# Patient Record
Sex: Female | Born: 1945 | Race: Black or African American | Hispanic: No | Marital: Married | Smoking: Never smoker
Health system: Southern US, Community
[De-identification: ages and names within clinical notes are randomized; demographics above are authoritative.]

## PROBLEM LIST (undated history)

## (undated) DIAGNOSIS — I10 Essential (primary) hypertension: Secondary | ICD-10-CM

---

## 2012-12-08 ENCOUNTER — Other Ambulatory Visit: Payer: Self-pay | Admitting: Family Medicine

## 2012-12-08 ENCOUNTER — Other Ambulatory Visit (HOSPITAL_COMMUNITY)
Admission: RE | Admit: 2012-12-08 | Discharge: 2012-12-08 | Disposition: A | Discharge status: Home / Self Care | Payer: Medicare Other | Source: Ambulatory Visit | Attending: Family Medicine | Admitting: Family Medicine

## 2012-12-08 DIAGNOSIS — Z124 Encounter for screening for malignant neoplasm of cervix: Secondary | ICD-10-CM | POA: Insufficient documentation

## 2012-12-08 DIAGNOSIS — Z1231 Encounter for screening mammogram for malignant neoplasm of breast: Secondary | ICD-10-CM

## 2012-12-08 DIAGNOSIS — E2839 Other primary ovarian failure: Secondary | ICD-10-CM

## 2013-01-11 ENCOUNTER — Ambulatory Visit
Admission: RE | Admit: 2013-01-11 | Discharge: 2013-01-11 | Disposition: A | Discharge status: Home / Self Care | Payer: Medicare Other | Source: Ambulatory Visit | Attending: Family Medicine | Admitting: Family Medicine

## 2013-01-11 DIAGNOSIS — E2839 Other primary ovarian failure: Secondary | ICD-10-CM

## 2013-01-11 DIAGNOSIS — Z1231 Encounter for screening mammogram for malignant neoplasm of breast: Secondary | ICD-10-CM

## 2013-12-07 ENCOUNTER — Other Ambulatory Visit: Payer: Self-pay

## 2013-12-07 DIAGNOSIS — Z1231 Encounter for screening mammogram for malignant neoplasm of breast: Secondary | ICD-10-CM

## 2014-01-12 ENCOUNTER — Ambulatory Visit: Payer: Medicare Other

## 2014-01-16 ENCOUNTER — Ambulatory Visit
Admission: RE | Admit: 2014-01-16 | Discharge: 2014-01-16 | Disposition: A | Discharge status: Home / Self Care | Payer: Medicare Other | Source: Ambulatory Visit

## 2014-01-16 DIAGNOSIS — Z1231 Encounter for screening mammogram for malignant neoplasm of breast: Secondary | ICD-10-CM

## 2014-12-18 ENCOUNTER — Other Ambulatory Visit: Payer: Self-pay

## 2014-12-18 DIAGNOSIS — Z1231 Encounter for screening mammogram for malignant neoplasm of breast: Secondary | ICD-10-CM

## 2015-01-29 ENCOUNTER — Ambulatory Visit
Admission: RE | Admit: 2015-01-29 | Discharge: 2015-01-29 | Disposition: A | Discharge status: Home / Self Care | Payer: Medicare Other | Source: Ambulatory Visit

## 2015-01-29 DIAGNOSIS — Z1231 Encounter for screening mammogram for malignant neoplasm of breast: Secondary | ICD-10-CM

## 2015-03-16 ENCOUNTER — Other Ambulatory Visit: Payer: Self-pay | Admitting: Family Medicine

## 2015-03-16 DIAGNOSIS — E2839 Other primary ovarian failure: Secondary | ICD-10-CM

## 2015-05-16 ENCOUNTER — Ambulatory Visit
Admission: RE | Admit: 2015-05-16 | Discharge: 2015-05-16 | Disposition: A | Discharge status: Home / Self Care | Payer: Medicare Other | Source: Ambulatory Visit | Attending: Family Medicine | Admitting: Family Medicine

## 2015-05-16 DIAGNOSIS — E2839 Other primary ovarian failure: Secondary | ICD-10-CM

## 2016-01-25 ENCOUNTER — Other Ambulatory Visit: Payer: Self-pay | Admitting: Family Medicine

## 2016-01-25 DIAGNOSIS — Z1231 Encounter for screening mammogram for malignant neoplasm of breast: Secondary | ICD-10-CM

## 2016-02-06 ENCOUNTER — Ambulatory Visit
Admission: RE | Admit: 2016-02-06 | Discharge: 2016-02-06 | Disposition: A | Discharge status: Home / Self Care | Payer: Medicare Other | Source: Ambulatory Visit | Attending: Family Medicine | Admitting: Family Medicine

## 2016-02-06 ENCOUNTER — Ambulatory Visit: Payer: Medicare Other

## 2016-02-06 DIAGNOSIS — Z1231 Encounter for screening mammogram for malignant neoplasm of breast: Secondary | ICD-10-CM

## 2016-05-25 ENCOUNTER — Encounter (HOSPITAL_COMMUNITY): Payer: Self-pay | Admitting: Emergency Medicine

## 2016-05-25 ENCOUNTER — Ambulatory Visit (HOSPITAL_COMMUNITY)
Admission: EM | Admit: 2016-05-25 | Discharge: 2016-05-25 | Disposition: A | Discharge status: Home / Self Care | Payer: Medicare Other | Attending: Emergency Medicine | Admitting: Emergency Medicine

## 2016-05-25 DIAGNOSIS — J069 Acute upper respiratory infection, unspecified: Secondary | ICD-10-CM | POA: Diagnosis not present

## 2016-05-25 DIAGNOSIS — B9789 Other viral agents as the cause of diseases classified elsewhere: Secondary | ICD-10-CM

## 2016-05-25 HISTORY — DX: Essential (primary) hypertension: I10

## 2016-05-25 MED ORDER — GUAIFENESIN ER 600 MG PO TB12: 600.0000 mg | ORAL_TABLET | Freq: Two times a day (BID) | ORAL | 0 refills | Status: AC | PRN

## 2016-05-25 MED ORDER — BENZONATATE 100 MG PO CAPS: 100.0000 mg | ORAL_CAPSULE | Freq: Three times a day (TID) | ORAL | 0 refills | Status: AC

## 2016-05-25 MED ORDER — AZITHROMYCIN 250 MG PO TABS: 250.0000 mg | ORAL_TABLET | Freq: Every day | ORAL | 0 refills | Status: AC

## 2016-05-25 NOTE — Discharge Instructions (Signed)
Your symptoms are likely due to a virus such as the common cold, however, if you developing worsening chest congestion with shortness of breath, persistent fever for 3 days, or symptoms not improving in 4-5 days, you may fill the antibiotic (azithromycin).  If you do fill the antibiotic,  please take antibiotics as prescribed and be sure to complete entire course even if you start to feel better to ensure infection does not come back.  You may stop taking over the counter Theraflu and start taking the prescribed benzonatate (Tessalon) and guaifenesin for your cough and congestion.  You may also take acetaminophen and ibuprofen for fever or pain.  Be sure to get at least 8 hours of sleep at night, preferably more while sick and to stay well hydrated with juice, sports drinks, and soups or broths.

## 2016-05-25 NOTE — ED Provider Notes (Signed)
CSN: [id number]     Arrival date & time 05/25/16  1159 History   First MD Initiated Contact with Patient 05/25/16 1232     No chief complaint on file.  (Consider location/radiation/quality/duration/timing/severity/associated sxs/prior Treatment) HPI  Tiffany Barrett is a 71 y.o. female presenting to UC with c/o cough for 6 days, minimally productive, mild sore throat from cough. Mild nausea and dizziness while in UC waiting room but that has since resolved.  Denies fever, chills, body aches, vomiting or diarrhea. No known sick contacts. Denies hx of asthma or COPD.   Past Medical History:  Diagnosis Date  . Hypertension    Past Surgical History:  Procedure Laterality Date  . CESAREAN SECTION     History reviewed. No pertinent family history. Social History  Substance Use Topics  . Smoking status: Never Smoker  . Smokeless tobacco: Never Used  . Alcohol use Yes     Comment: monthly   OB History    No data available     Review of Systems  Constitutional: Negative for chills and fever.  HENT: Positive for congestion and sore throat ( minimal). Negative for ear pain, trouble swallowing and voice change.   Respiratory: Positive for cough. Negative for shortness of breath.   Cardiovascular: Negative for chest pain and palpitations.  Gastrointestinal: Positive for nausea. Negative for abdominal pain, diarrhea and vomiting.  Musculoskeletal: Negative for arthralgias, back pain and myalgias.  Skin: Negative for rash.  Neurological: Positive for dizziness ( mild). Negative for light-headedness and headaches.    Allergies  Patient has no known allergies.  Home Medications   Prior to Admission medications   Medication Sig Start Date End Date Taking? Authorizing Provider  losartan-hydrochlorothiazide (HYZAAR) 50-12.5 MG tablet Take 1 tablet by mouth daily.   Yes Historical Provider, MD  azithromycin (ZITHROMAX) 250 MG tablet Take 1 tablet (250 mg total) by mouth daily. Take first  2 tablets together, then 1 every day until finished. 05/25/16   Noland Fordyce, PA-C  benzonatate (TESSALON) 100 MG capsule Take 1 capsule (100 mg total) by mouth every 8 (eight) hours. 05/25/16   Noland Fordyce, PA-C  guaiFENesin (MUCINEX) 600 MG 12 hr tablet Take 1-2 tablets (600-1,200 mg total) by mouth 2 (two) times daily as needed for to loosen phlegm. Take with a large glass of water 05/25/16   Noland Fordyce, PA-C   Meds Ordered and Administered this Visit  Medications - No data to display  BP 107/71 (BP Location: Right Arm)   Temp 97.9 F (36.6 C) (Oral)   Resp 18   SpO2 98%  No data found.   Physical Exam  Constitutional: She is oriented to person, place, and time. She appears well-developed and well-nourished. No distress.  HENT:  Head: Normocephalic and atraumatic.  Right Ear: Tympanic membrane normal.  Left Ear: Tympanic membrane normal.  Nose: Nose normal.  Mouth/Throat: Uvula is midline, oropharynx is clear and moist and mucous membranes are normal.  Eyes: EOM are normal.  Neck: Normal range of motion. Neck supple.  Cardiovascular: Normal rate and regular rhythm.   Pulmonary/Chest: Effort normal and breath sounds normal. No stridor. No respiratory distress. She has no wheezes. She has no rales.  Musculoskeletal: Normal range of motion.  Lymphadenopathy:    She has no cervical adenopathy.  Neurological: She is alert and oriented to person, place, and time.  Skin: Skin is warm and dry. She is not diaphoretic.  Psychiatric: She has a normal mood and affect. Her behavior is  normal.  Nursing note and vitals reviewed.   Urgent Care Course     Procedures (including critical care time)  Labs Review Labs Reviewed - No data to display  Imaging Review No results found.    MDM   1. Viral URI with cough    Pt c/o 6 days of URI symptoms.  Lungs: CTAB Pt is febrile.  No evidence of bacterial infection at this time. Symptoms likely viral. Encouraged symptomatic  treatment. She may stop taking Theraflu, which may be causing nausea and dizziness. Rx: Tessalon and mucinex  Prescription to hold with expiration date for azithromycin. Pt to fill if persistent fever develops or not improving in 1 week.  F/u with PCP in 7-10 days if not improving. Discussed symptoms that warrant emergent care in the ED. Patient verbalized understanding and agreement with treatment plan.     Noland Fordyce, PA-C 05/25/16 1257

## 2016-05-25 NOTE — ED Triage Notes (Signed)
The patient presented to the [facility] with a complaint of a cough x 6 days. The patient reported using OTC meds with minimal results.

## 2016-06-10 ENCOUNTER — Other Ambulatory Visit (HOSPITAL_COMMUNITY)
Admission: RE | Admit: 2016-06-10 | Discharge: 2016-06-10 | Disposition: A | Discharge status: Home / Self Care | Payer: Medicare Other | Source: Ambulatory Visit | Attending: Family Medicine | Admitting: Family Medicine

## 2016-06-10 ENCOUNTER — Other Ambulatory Visit: Payer: Self-pay | Admitting: Family Medicine

## 2016-06-10 DIAGNOSIS — Z124 Encounter for screening for malignant neoplasm of cervix: Secondary | ICD-10-CM | POA: Insufficient documentation

## 2016-06-13 LAB — CYTOLOGY - PAP: Diagnosis: NEGATIVE

## 2017-01-06 ENCOUNTER — Other Ambulatory Visit: Payer: Self-pay | Admitting: Family Medicine

## 2017-01-06 DIAGNOSIS — Z1231 Encounter for screening mammogram for malignant neoplasm of breast: Secondary | ICD-10-CM

## 2017-02-06 ENCOUNTER — Ambulatory Visit
Admission: RE | Admit: 2017-02-06 | Discharge: 2017-02-06 | Disposition: A | Discharge status: Home / Self Care | Payer: Medicare Other | Source: Ambulatory Visit | Attending: Family Medicine | Admitting: Family Medicine

## 2017-02-06 DIAGNOSIS — Z1231 Encounter for screening mammogram for malignant neoplasm of breast: Secondary | ICD-10-CM

## 2017-06-26 ENCOUNTER — Other Ambulatory Visit: Payer: Self-pay | Admitting: Family Medicine

## 2017-06-26 DIAGNOSIS — E2839 Other primary ovarian failure: Secondary | ICD-10-CM

## 2017-07-01 ENCOUNTER — Other Ambulatory Visit: Payer: Self-pay | Admitting: Family Medicine

## 2017-07-01 DIAGNOSIS — Z1231 Encounter for screening mammogram for malignant neoplasm of breast: Secondary | ICD-10-CM

## 2018-02-08 ENCOUNTER — Other Ambulatory Visit: Payer: Medicare Other

## 2018-02-08 ENCOUNTER — Inpatient Hospital Stay: Admission: RE | Admit: 2018-02-08 | Payer: Medicare Other | Source: Ambulatory Visit

## 2018-03-22 ENCOUNTER — Ambulatory Visit
Admission: RE | Admit: 2018-03-22 | Discharge: 2018-03-22 | Disposition: A | Discharge status: Home / Self Care | Payer: Medicare Other | Source: Ambulatory Visit | Attending: Family Medicine | Admitting: Family Medicine

## 2018-03-22 DIAGNOSIS — Z1231 Encounter for screening mammogram for malignant neoplasm of breast: Secondary | ICD-10-CM

## 2018-03-22 DIAGNOSIS — E2839 Other primary ovarian failure: Secondary | ICD-10-CM

## 2019-02-15 ENCOUNTER — Other Ambulatory Visit: Payer: Self-pay | Admitting: Family Medicine

## 2019-02-15 DIAGNOSIS — Z1231 Encounter for screening mammogram for malignant neoplasm of breast: Secondary | ICD-10-CM

## 2019-04-04 ENCOUNTER — Ambulatory Visit
Admission: RE | Admit: 2019-04-04 | Discharge: 2019-04-04 | Disposition: A | Discharge status: Home / Self Care | Payer: Medicare Other | Source: Ambulatory Visit | Attending: Family Medicine | Admitting: Family Medicine

## 2019-04-04 ENCOUNTER — Other Ambulatory Visit: Payer: Self-pay

## 2019-04-04 DIAGNOSIS — Z1231 Encounter for screening mammogram for malignant neoplasm of breast: Secondary | ICD-10-CM

## 2019-06-01 ENCOUNTER — Ambulatory Visit: Payer: Medicare Other

## 2019-06-03 ENCOUNTER — Ambulatory Visit: Payer: Medicare Other

## 2019-06-14 ENCOUNTER — Ambulatory Visit: Payer: Medicare Other

## 2019-06-18 ENCOUNTER — Ambulatory Visit: Payer: Medicare Other

## 2020-01-30 ENCOUNTER — Other Ambulatory Visit: Payer: Self-pay | Admitting: Family Medicine

## 2020-01-30 DIAGNOSIS — Z1239 Encounter for other screening for malignant neoplasm of breast: Secondary | ICD-10-CM

## 2020-01-31 ENCOUNTER — Other Ambulatory Visit: Payer: Self-pay | Admitting: Family Medicine

## 2020-01-31 DIAGNOSIS — E2839 Other primary ovarian failure: Secondary | ICD-10-CM

## 2020-01-31 DIAGNOSIS — M8580 Other specified disorders of bone density and structure, unspecified site: Secondary | ICD-10-CM

## 2020-04-12 ENCOUNTER — Ambulatory Visit
Admission: RE | Admit: 2020-04-12 | Discharge: 2020-04-12 | Disposition: A | Discharge status: Home / Self Care | Payer: Medicare Other | Source: Ambulatory Visit | Attending: Family Medicine | Admitting: Family Medicine

## 2020-04-12 ENCOUNTER — Other Ambulatory Visit: Payer: Self-pay

## 2020-04-12 DIAGNOSIS — Z1239 Encounter for other screening for malignant neoplasm of breast: Secondary | ICD-10-CM

## 2020-05-17 ENCOUNTER — Other Ambulatory Visit: Payer: Medicare Other

## 2020-08-13 DIAGNOSIS — M545 Low back pain, unspecified: Secondary | ICD-10-CM | POA: Diagnosis not present

## 2020-08-13 DIAGNOSIS — K219 Gastro-esophageal reflux disease without esophagitis: Secondary | ICD-10-CM | POA: Diagnosis not present

## 2020-08-13 DIAGNOSIS — R7303 Prediabetes: Secondary | ICD-10-CM | POA: Diagnosis not present

## 2020-08-13 DIAGNOSIS — Z0000 Encounter for general adult medical examination without abnormal findings: Secondary | ICD-10-CM | POA: Diagnosis not present

## 2020-08-13 DIAGNOSIS — J309 Allergic rhinitis, unspecified: Secondary | ICD-10-CM | POA: Diagnosis not present

## 2020-08-13 DIAGNOSIS — Z1389 Encounter for screening for other disorder: Secondary | ICD-10-CM | POA: Diagnosis not present

## 2020-08-13 DIAGNOSIS — I10 Essential (primary) hypertension: Secondary | ICD-10-CM | POA: Diagnosis not present

## 2020-08-13 DIAGNOSIS — M8588 Other specified disorders of bone density and structure, other site: Secondary | ICD-10-CM | POA: Diagnosis not present

## 2020-08-23 ENCOUNTER — Ambulatory Visit: Payer: Medicare Other | Admitting: Podiatry

## 2020-08-23 ENCOUNTER — Other Ambulatory Visit: Payer: Self-pay

## 2020-08-23 ENCOUNTER — Encounter: Payer: Self-pay | Admitting: Podiatry

## 2020-08-23 DIAGNOSIS — M79675 Pain in left toe(s): Secondary | ICD-10-CM | POA: Diagnosis not present

## 2020-08-23 DIAGNOSIS — E2839 Other primary ovarian failure: Secondary | ICD-10-CM | POA: Insufficient documentation

## 2020-08-23 DIAGNOSIS — M545 Low back pain, unspecified: Secondary | ICD-10-CM | POA: Insufficient documentation

## 2020-08-23 DIAGNOSIS — K219 Gastro-esophageal reflux disease without esophagitis: Secondary | ICD-10-CM | POA: Insufficient documentation

## 2020-08-23 DIAGNOSIS — N951 Menopausal and female climacteric states: Secondary | ICD-10-CM | POA: Insufficient documentation

## 2020-08-23 DIAGNOSIS — G4733 Obstructive sleep apnea (adult) (pediatric): Secondary | ICD-10-CM | POA: Insufficient documentation

## 2020-08-23 DIAGNOSIS — M542 Cervicalgia: Secondary | ICD-10-CM | POA: Insufficient documentation

## 2020-08-23 DIAGNOSIS — M8588 Other specified disorders of bone density and structure, other site: Secondary | ICD-10-CM | POA: Insufficient documentation

## 2020-08-23 DIAGNOSIS — L600 Ingrowing nail: Secondary | ICD-10-CM

## 2020-08-23 DIAGNOSIS — J309 Allergic rhinitis, unspecified: Secondary | ICD-10-CM | POA: Insufficient documentation

## 2020-08-23 DIAGNOSIS — I10 Essential (primary) hypertension: Secondary | ICD-10-CM | POA: Insufficient documentation

## 2020-08-23 DIAGNOSIS — Z6830 Body mass index (BMI) 30.0-30.9, adult: Secondary | ICD-10-CM | POA: Insufficient documentation

## 2020-08-23 DIAGNOSIS — R739 Hyperglycemia, unspecified: Secondary | ICD-10-CM | POA: Insufficient documentation

## 2020-08-23 NOTE — Patient Instructions (Signed)

## 2020-08-29 NOTE — Progress Notes (Signed)
Subjective:   Patient ID: Tiffany Barrett, female   DOB: 75 y.o.   MRN: [id number]   HPI 75 year old female presents the office today for concerns of ingrown toenail left big toe, medial aspect.  She said only is bothersome when wearing shoes.  She has tried over-the-counter Dr. Felicie Morn ingrown toenail removal.  Denies any swelling or redness or any drainage.  Mostly wear shoes which causes discomfort.  She has no other concerns today.   Review of Systems  All other systems reviewed and are negative.  Past Medical History:  Diagnosis Date  . Hypertension     Past Surgical History:  Procedure Laterality Date  . CESAREAN SECTION       Current Outpatient Medications:  .  acetaminophen (TYLENOL) 500 MG tablet, 2 tablets, Disp: , Rfl:  .  amLODipine (NORVASC) 5 MG tablet, Take 1 tablet by mouth daily., Disp: , Rfl:  .  azithromycin (ZITHROMAX) 250 MG tablet, Take 1 tablet (250 mg total) by mouth daily. Take first 2 tablets together, then 1 every day until finished., Disp: 6 tablet, Rfl: 0 .  benzonatate (TESSALON) 100 MG capsule, Take 1 capsule (100 mg total) by mouth every 8 (eight) hours., Disp: 21 capsule, Rfl: 0 .  calcium carbonate (SUPER CALCIUM) 1500 (600 Ca) MG TABS tablet, 1 tablet, Disp: , Rfl:  .  guaiFENesin (MUCINEX) 600 MG 12 hr tablet, Take 1-2 tablets (600-1,200 mg total) by mouth 2 (two) times daily as needed for to loosen phlegm. Take with a large glass of water, Disp: 30 tablet, Rfl: 0 .  hydrochlorothiazide (HYDRODIURIL) 25 MG tablet, 1 tablet, Disp: , Rfl:  .  loratadine (CLARITIN) 10 MG tablet, 1 tablet, Disp: , Rfl:  .  losartan (COZAAR) 100 MG tablet, Take 1 tablet by mouth every morning., Disp: , Rfl:  .  losartan-hydrochlorothiazide (HYZAAR) 50-12.5 MG tablet, Take 1 tablet by mouth daily., Disp: , Rfl:  .  Multiple Vitamin (ONE DAILY) tablet, 1 tablet, Disp: , Rfl:  .  olmesartan (BENICAR) 40 MG tablet, Take 40 mg by mouth daily., Disp: , Rfl:    Allergies  Allergen Reactions  . Meloxicam     Other reaction(s): drowsiness         Objective:  Physical Exam  General: AAO x3, NAD  Dermatological: Incurvation present to the medial aspect left hallux toenail and there is localized edema but there is no erythema or warmth there is no drainage or pus or ascending cellulitis.  No open lesions.  Vascular: Dorsalis Pedis artery and Posterior Tibial artery pedal pulses are 2/4 bilateral with immedate capillary fill time. There is no pain with calf compression, swelling, warmth, erythema.   Neruologic: Grossly intact via light touch bilateral.   Musculoskeletal: No gross boney pedal deformities bilateral. No pain, crepitus, or limitation noted with foot and ankle range of motion bilateral. Muscular strength 5/5 in all groups tested bilateral.  Gait: Unassisted, Nonantalgic.       Assessment:   Ingrown toenail left medial nail border     Plan:  -Treatment options discussed including all alternatives, risks, and complications -Etiology of symptoms were discussed -I discussed the partial nail avulsion with chemical matricectomy.  She is going to schedule this at her convenience and wants to hold off on this today.  Recommended Epson salt soaks as well as antibiotic ointment dressing changes daily.  Trula Slade DPM

## 2020-09-05 ENCOUNTER — Ambulatory Visit
Admission: RE | Admit: 2020-09-05 | Discharge: 2020-09-05 | Disposition: A | Discharge status: Home / Self Care | Payer: Medicare Other | Source: Ambulatory Visit | Attending: Family Medicine | Admitting: Family Medicine

## 2020-09-05 ENCOUNTER — Other Ambulatory Visit: Payer: Self-pay

## 2020-09-05 DIAGNOSIS — Z780 Asymptomatic menopausal state: Secondary | ICD-10-CM | POA: Diagnosis not present

## 2020-09-05 DIAGNOSIS — M85852 Other specified disorders of bone density and structure, left thigh: Secondary | ICD-10-CM | POA: Diagnosis not present

## 2020-09-05 DIAGNOSIS — E2839 Other primary ovarian failure: Secondary | ICD-10-CM

## 2020-09-05 DIAGNOSIS — M8580 Other specified disorders of bone density and structure, unspecified site: Secondary | ICD-10-CM

## 2021-02-25 DIAGNOSIS — Z1211 Encounter for screening for malignant neoplasm of colon: Secondary | ICD-10-CM | POA: Diagnosis not present

## 2021-02-25 DIAGNOSIS — J309 Allergic rhinitis, unspecified: Secondary | ICD-10-CM | POA: Diagnosis not present

## 2021-02-25 DIAGNOSIS — M545 Low back pain, unspecified: Secondary | ICD-10-CM | POA: Diagnosis not present

## 2021-02-25 DIAGNOSIS — M8588 Other specified disorders of bone density and structure, other site: Secondary | ICD-10-CM | POA: Diagnosis not present

## 2021-02-25 DIAGNOSIS — I10 Essential (primary) hypertension: Secondary | ICD-10-CM | POA: Diagnosis not present

## 2021-02-25 DIAGNOSIS — R7303 Prediabetes: Secondary | ICD-10-CM | POA: Diagnosis not present

## 2021-02-26 DIAGNOSIS — R7303 Prediabetes: Secondary | ICD-10-CM | POA: Diagnosis not present

## 2021-03-04 ENCOUNTER — Other Ambulatory Visit: Payer: Self-pay | Admitting: Family Medicine

## 2021-03-04 DIAGNOSIS — Z1231 Encounter for screening mammogram for malignant neoplasm of breast: Secondary | ICD-10-CM

## 2021-03-12 DIAGNOSIS — G4733 Obstructive sleep apnea (adult) (pediatric): Secondary | ICD-10-CM | POA: Diagnosis not present

## 2021-04-11 DIAGNOSIS — G4733 Obstructive sleep apnea (adult) (pediatric): Secondary | ICD-10-CM | POA: Diagnosis not present

## 2021-04-15 ENCOUNTER — Ambulatory Visit: Payer: Medicare Other

## 2021-05-08 ENCOUNTER — Other Ambulatory Visit: Payer: Self-pay

## 2021-05-08 ENCOUNTER — Ambulatory Visit
Admission: RE | Admit: 2021-05-08 | Discharge: 2021-05-08 | Disposition: A | Discharge status: Home / Self Care | Payer: Medicare Other | Source: Ambulatory Visit | Attending: Family Medicine | Admitting: Family Medicine

## 2021-05-08 DIAGNOSIS — Z1231 Encounter for screening mammogram for malignant neoplasm of breast: Secondary | ICD-10-CM

## 2021-05-12 DIAGNOSIS — G4733 Obstructive sleep apnea (adult) (pediatric): Secondary | ICD-10-CM | POA: Diagnosis not present

## 2021-05-15 DIAGNOSIS — G4733 Obstructive sleep apnea (adult) (pediatric): Secondary | ICD-10-CM | POA: Diagnosis not present

## 2021-05-24 IMAGING — MG DIGITAL SCREENING BILAT W/ TOMO W/ CAD
6 of 10 series · 6 of 30 positions shown · non-contrast
Comparison: Previous exam(s).

CLINICAL DATA: Screening.

EXAM:
DIGITAL SCREENING BILATERAL MAMMOGRAM WITH TOMO AND CAD

[R CC synth-2D]
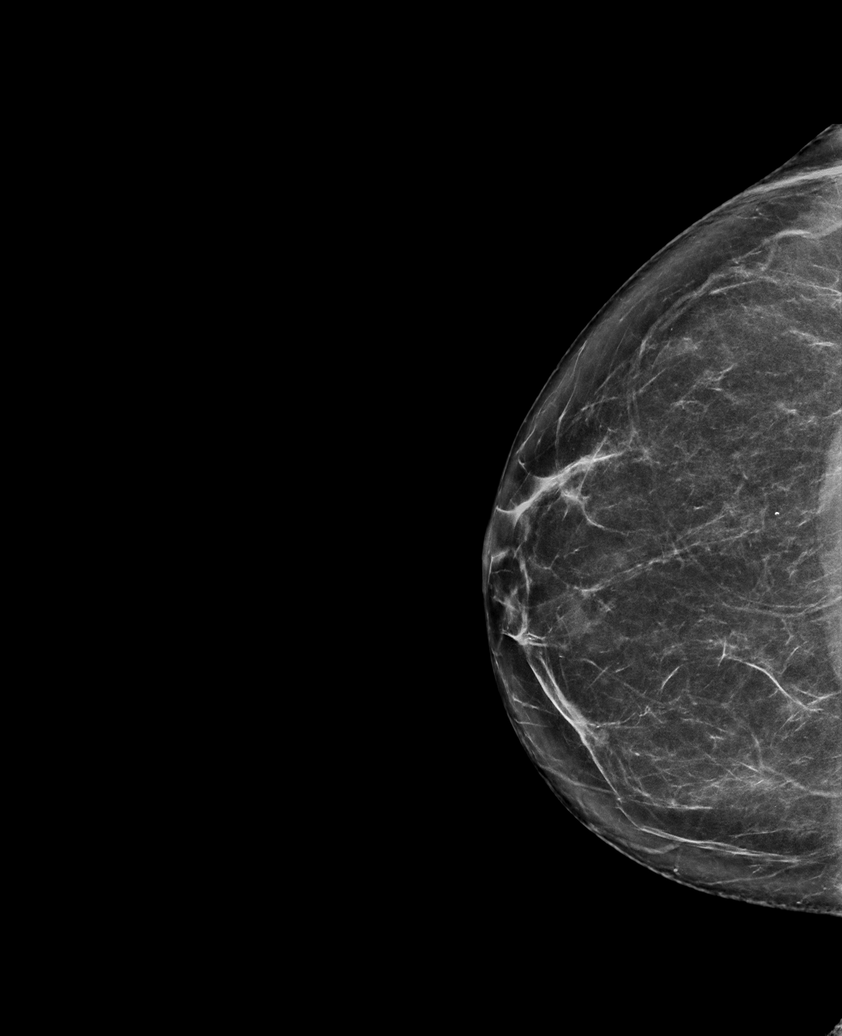

[L MLO synth-2D (1 of 2)]
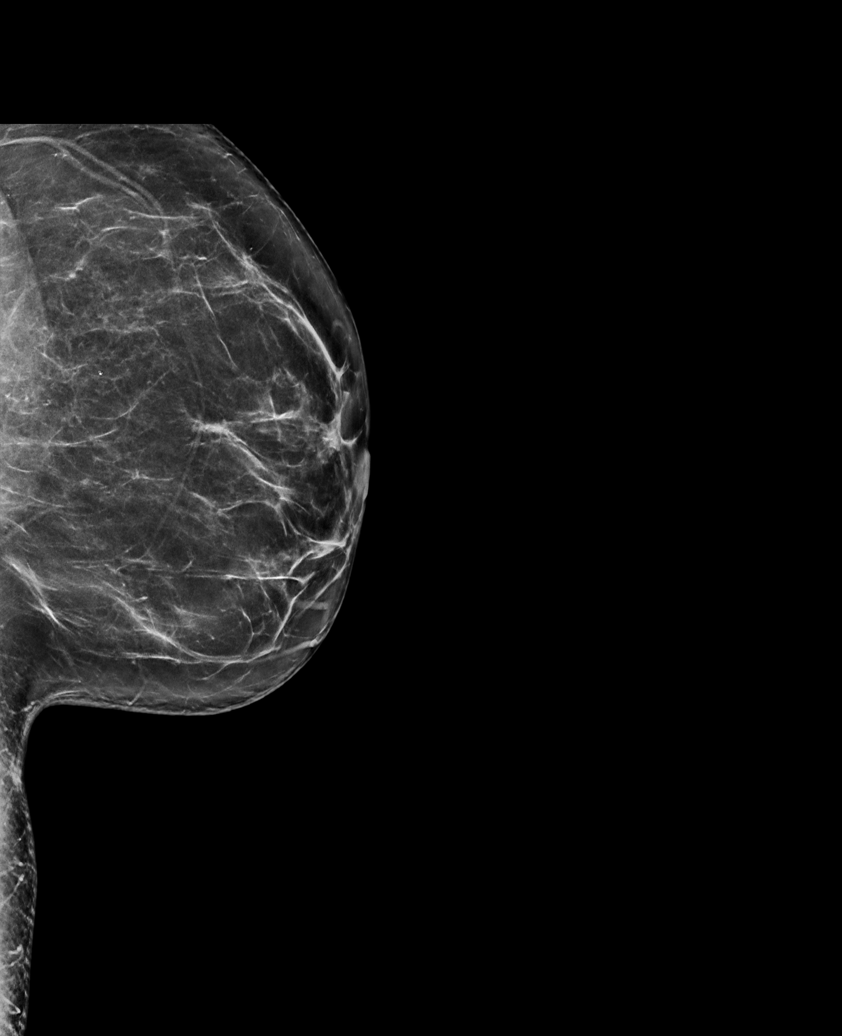

[L MLO synth-2D (2 of 2)]
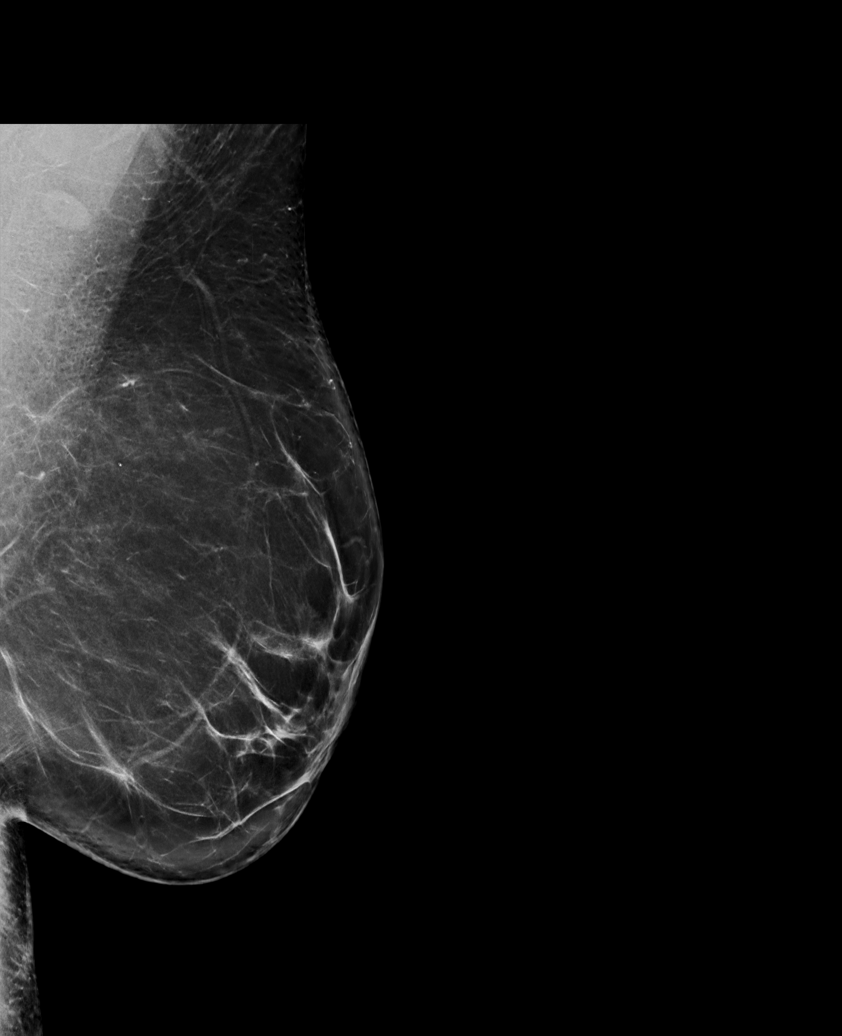

[R MLO synth-2D]
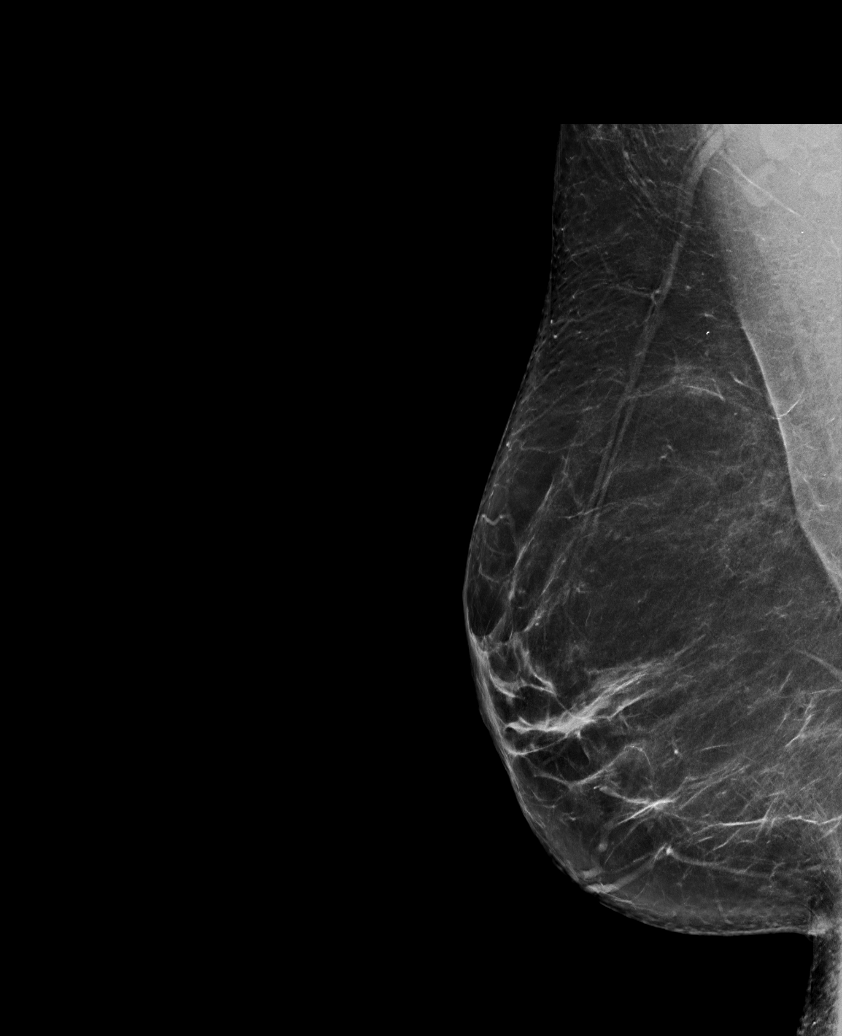

[L CC synth-2D]
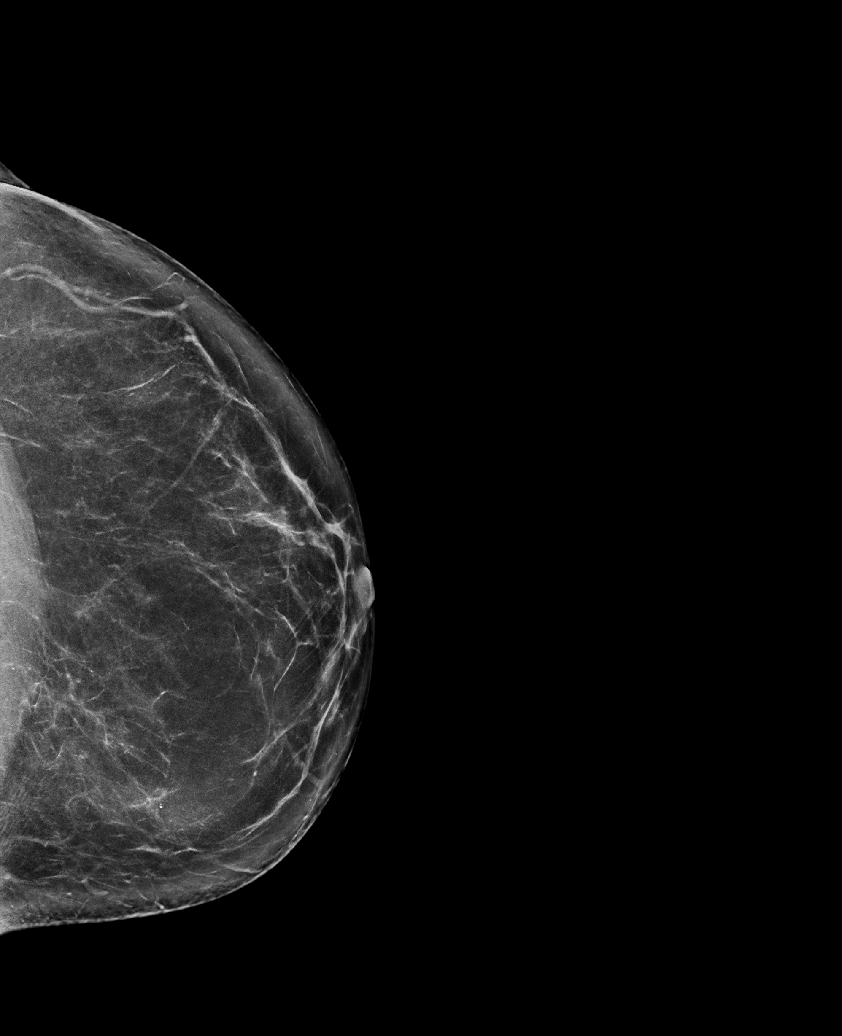

[L CC tomo · tomo slice 41/81.0]
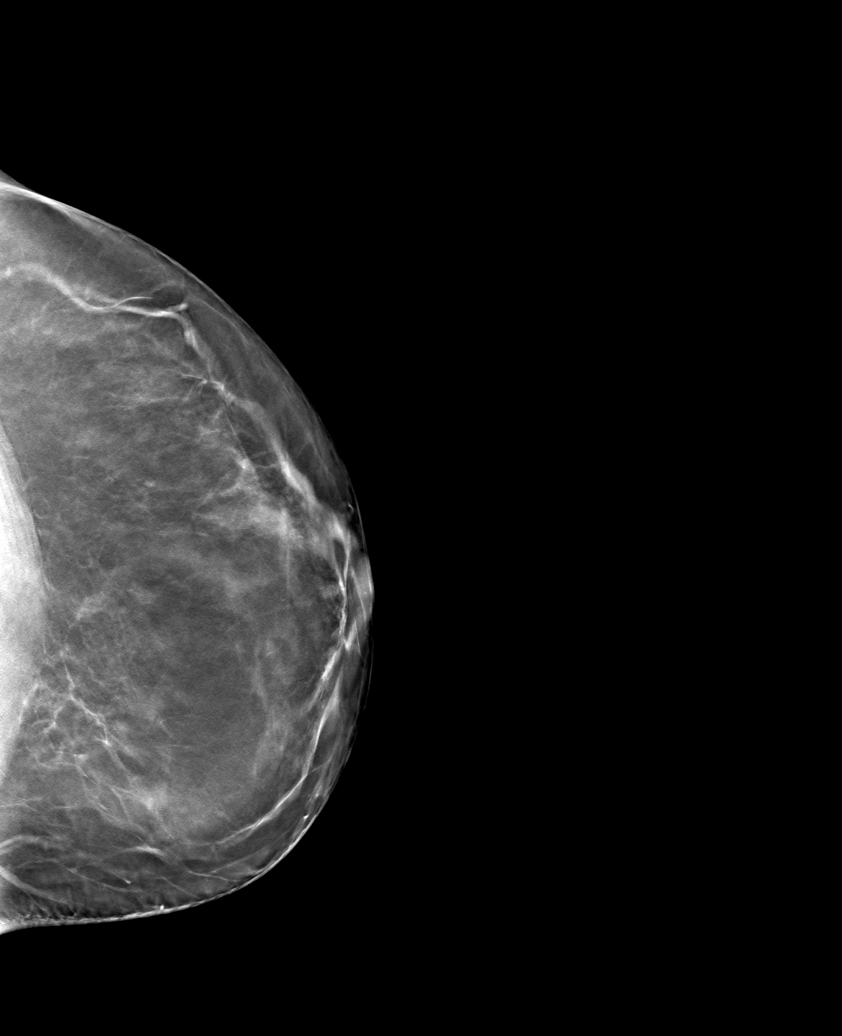

[6 of 30 positions shown; findings below may reference images not displayed]

ACR Breast Density Category b: There are scattered areas of
fibroglandular density.
FINDINGS: There are no findings suspicious for malignancy. Images were
processed with CAD.
IMPRESSION: No mammographic evidence of malignancy. A result letter of this
screening mammogram will be mailed directly to the patient.

RECOMMENDATION:
Screening mammogram in one year. (Code:CN-U-775)

BI-RADS CATEGORY  1: Negative.

## 2021-06-12 DIAGNOSIS — G4733 Obstructive sleep apnea (adult) (pediatric): Secondary | ICD-10-CM | POA: Diagnosis not present

## 2021-06-17 DIAGNOSIS — Z1211 Encounter for screening for malignant neoplasm of colon: Secondary | ICD-10-CM | POA: Diagnosis not present

## 2021-06-17 DIAGNOSIS — D122 Benign neoplasm of ascending colon: Secondary | ICD-10-CM | POA: Diagnosis not present

## 2021-06-17 DIAGNOSIS — K5730 Diverticulosis of large intestine without perforation or abscess without bleeding: Secondary | ICD-10-CM | POA: Diagnosis not present

## 2021-06-17 DIAGNOSIS — K648 Other hemorrhoids: Secondary | ICD-10-CM | POA: Diagnosis not present

## 2021-06-19 DIAGNOSIS — D122 Benign neoplasm of ascending colon: Secondary | ICD-10-CM | POA: Diagnosis not present

## 2021-06-26 DIAGNOSIS — G4733 Obstructive sleep apnea (adult) (pediatric): Secondary | ICD-10-CM | POA: Diagnosis not present

## 2021-07-10 DIAGNOSIS — G4733 Obstructive sleep apnea (adult) (pediatric): Secondary | ICD-10-CM | POA: Diagnosis not present

## 2021-08-10 DIAGNOSIS — G4733 Obstructive sleep apnea (adult) (pediatric): Secondary | ICD-10-CM | POA: Diagnosis not present

## 2021-08-19 DIAGNOSIS — I10 Essential (primary) hypertension: Secondary | ICD-10-CM | POA: Diagnosis not present

## 2021-08-19 DIAGNOSIS — M545 Low back pain, unspecified: Secondary | ICD-10-CM | POA: Diagnosis not present

## 2021-08-19 DIAGNOSIS — R7303 Prediabetes: Secondary | ICD-10-CM | POA: Diagnosis not present

## 2021-08-19 DIAGNOSIS — Z0000 Encounter for general adult medical examination without abnormal findings: Secondary | ICD-10-CM | POA: Diagnosis not present

## 2021-08-19 DIAGNOSIS — M8588 Other specified disorders of bone density and structure, other site: Secondary | ICD-10-CM | POA: Diagnosis not present

## 2021-08-19 DIAGNOSIS — Z1389 Encounter for screening for other disorder: Secondary | ICD-10-CM | POA: Diagnosis not present

## 2021-08-19 DIAGNOSIS — J309 Allergic rhinitis, unspecified: Secondary | ICD-10-CM | POA: Diagnosis not present

## 2021-09-09 DIAGNOSIS — G4733 Obstructive sleep apnea (adult) (pediatric): Secondary | ICD-10-CM | POA: Diagnosis not present

## 2021-10-01 DIAGNOSIS — H2513 Age-related nuclear cataract, bilateral: Secondary | ICD-10-CM | POA: Diagnosis not present

## 2021-10-01 DIAGNOSIS — H40033 Anatomical narrow angle, bilateral: Secondary | ICD-10-CM | POA: Diagnosis not present

## 2021-10-10 DIAGNOSIS — G4733 Obstructive sleep apnea (adult) (pediatric): Secondary | ICD-10-CM | POA: Diagnosis not present

## 2021-11-04 DIAGNOSIS — H2513 Age-related nuclear cataract, bilateral: Secondary | ICD-10-CM | POA: Diagnosis not present

## 2021-11-04 DIAGNOSIS — H40013 Open angle with borderline findings, low risk, bilateral: Secondary | ICD-10-CM | POA: Diagnosis not present

## 2021-11-04 DIAGNOSIS — H04123 Dry eye syndrome of bilateral lacrimal glands: Secondary | ICD-10-CM | POA: Diagnosis not present

## 2021-11-09 DIAGNOSIS — G4733 Obstructive sleep apnea (adult) (pediatric): Secondary | ICD-10-CM | POA: Diagnosis not present

## 2021-12-10 DIAGNOSIS — G4733 Obstructive sleep apnea (adult) (pediatric): Secondary | ICD-10-CM | POA: Diagnosis not present

## 2022-01-03 DIAGNOSIS — H2513 Age-related nuclear cataract, bilateral: Secondary | ICD-10-CM | POA: Diagnosis not present

## 2022-01-03 DIAGNOSIS — Z961 Presence of intraocular lens: Secondary | ICD-10-CM | POA: Diagnosis not present

## 2022-01-03 DIAGNOSIS — H25812 Combined forms of age-related cataract, left eye: Secondary | ICD-10-CM | POA: Diagnosis not present

## 2022-06-12 ENCOUNTER — Other Ambulatory Visit: Payer: Self-pay | Admitting: Family Medicine

## 2022-06-12 DIAGNOSIS — Z1231 Encounter for screening mammogram for malignant neoplasm of breast: Secondary | ICD-10-CM

## 2022-06-17 ENCOUNTER — Ambulatory Visit
Admission: RE | Admit: 2022-06-17 | Discharge: 2022-06-17 | Disposition: A | Discharge status: Home / Self Care | Payer: Medicare Other | Source: Ambulatory Visit | Attending: Family Medicine | Admitting: Family Medicine

## 2022-06-17 DIAGNOSIS — Z1231 Encounter for screening mammogram for malignant neoplasm of breast: Secondary | ICD-10-CM

## 2022-08-27 ENCOUNTER — Other Ambulatory Visit: Payer: Self-pay | Admitting: Family Medicine

## 2022-08-27 DIAGNOSIS — J309 Allergic rhinitis, unspecified: Secondary | ICD-10-CM | POA: Diagnosis not present

## 2022-08-27 DIAGNOSIS — M8588 Other specified disorders of bone density and structure, other site: Secondary | ICD-10-CM | POA: Diagnosis not present

## 2022-08-27 DIAGNOSIS — E2839 Other primary ovarian failure: Secondary | ICD-10-CM

## 2022-08-27 DIAGNOSIS — G4733 Obstructive sleep apnea (adult) (pediatric): Secondary | ICD-10-CM | POA: Diagnosis not present

## 2022-08-27 DIAGNOSIS — R7303 Prediabetes: Secondary | ICD-10-CM | POA: Diagnosis not present

## 2022-08-27 DIAGNOSIS — K219 Gastro-esophageal reflux disease without esophagitis: Secondary | ICD-10-CM | POA: Diagnosis not present

## 2022-08-27 DIAGNOSIS — I10 Essential (primary) hypertension: Secondary | ICD-10-CM | POA: Diagnosis not present

## 2022-08-27 DIAGNOSIS — M545 Low back pain, unspecified: Secondary | ICD-10-CM | POA: Diagnosis not present

## 2022-08-27 DIAGNOSIS — Z0000 Encounter for general adult medical examination without abnormal findings: Secondary | ICD-10-CM | POA: Diagnosis not present

## 2022-09-07 DIAGNOSIS — J209 Acute bronchitis, unspecified: Secondary | ICD-10-CM | POA: Diagnosis not present

## 2022-09-16 DIAGNOSIS — D72829 Elevated white blood cell count, unspecified: Secondary | ICD-10-CM | POA: Diagnosis not present

## 2022-09-19 ENCOUNTER — Ambulatory Visit
Admission: RE | Admit: 2022-09-19 | Discharge: 2022-09-19 | Disposition: A | Discharge status: Home / Self Care | Payer: Medicare Other | Source: Ambulatory Visit | Attending: Family Medicine | Admitting: Family Medicine

## 2022-09-19 DIAGNOSIS — M810 Age-related osteoporosis without current pathological fracture: Secondary | ICD-10-CM | POA: Diagnosis not present

## 2022-09-19 DIAGNOSIS — E2839 Other primary ovarian failure: Secondary | ICD-10-CM

## 2022-10-29 DIAGNOSIS — D72829 Elevated white blood cell count, unspecified: Secondary | ICD-10-CM | POA: Diagnosis not present

## 2022-12-24 ENCOUNTER — Encounter: Payer: Self-pay | Admitting: Podiatry

## 2022-12-24 ENCOUNTER — Ambulatory Visit: Payer: Medicare Other | Admitting: Podiatry

## 2022-12-24 DIAGNOSIS — L600 Ingrowing nail: Secondary | ICD-10-CM | POA: Diagnosis not present

## 2022-12-24 NOTE — Patient Instructions (Signed)

## 2022-12-24 NOTE — Progress Notes (Signed)
Subjective:  Patient ID: Tiffany Barrett, female    DOB: July 24, 1945,  MRN: [id number]  Chief Complaint  Patient presents with   Ingrown Toenail    Rm11: Patient is here for bilateral ingrown toe nails of hallux, patient also has questions on curving of other toes, left 2 and 3, right 3,and 4    77 y.o. female presents with the above complaint.  Patient presents with right hallux medial border ingrown.  Patient states pain for touch is progressive gotten worse worse with ambulation worse with pressure she would like to have it removed.  She has not seen MRIs prior to seeing me denies any other acute complaints today scale 7 out of 10 dull aching nature.   Review of Systems: Negative except as noted in the HPI. Denies N/V/F/Ch.  Past Medical History:  Diagnosis Date   Hypertension     Current Outpatient Medications:    acetaminophen (TYLENOL) 500 MG tablet, 2 tablets, Disp: , Rfl:    amLODipine (NORVASC) 5 MG tablet, Take 1 tablet by mouth daily., Disp: , Rfl:    azithromycin (ZITHROMAX) 250 MG tablet, Take 1 tablet (250 mg total) by mouth daily. Take first 2 tablets together, then 1 every day until finished., Disp: 6 tablet, Rfl: 0   benzonatate (TESSALON) 100 MG capsule, Take 1 capsule (100 mg total) by mouth every 8 (eight) hours., Disp: 21 capsule, Rfl: 0   calcium carbonate (SUPER CALCIUM) 1500 (600 Ca) MG TABS tablet, 1 tablet, Disp: , Rfl:    guaiFENesin (MUCINEX) 600 MG 12 hr tablet, Take 1-2 tablets (600-1,200 mg total) by mouth 2 (two) times daily as needed for to loosen phlegm. Take with a large glass of water, Disp: 30 tablet, Rfl: 0   hydrochlorothiazide (HYDRODIURIL) 25 MG tablet, 1 tablet, Disp: , Rfl:    loratadine (CLARITIN) 10 MG tablet, 1 tablet, Disp: , Rfl:    losartan (COZAAR) 100 MG tablet, Take 1 tablet by mouth every morning., Disp: , Rfl:    losartan-hydrochlorothiazide (HYZAAR) 50-12.5 MG tablet, Take 1 tablet by mouth daily., Disp: , Rfl:    Multiple  Vitamin (ONE DAILY) tablet, 1 tablet, Disp: , Rfl:    olmesartan (BENICAR) 40 MG tablet, Take 40 mg by mouth daily., Disp: , Rfl:   Social History   Tobacco Use  Smoking Status Never  Smokeless Tobacco Never    Allergies  Allergen Reactions   Meloxicam     Other reaction(s): drowsiness   Objective:  There were no vitals filed for this visit. There is no height or weight on file to calculate BMI. Constitutional Well developed. Well nourished.  Vascular Dorsalis pedis pulses palpable bilaterally. Posterior tibial pulses palpable bilaterally. Capillary refill normal to all digits.  No cyanosis or clubbing noted. Pedal hair growth normal.  Neurologic Normal speech. Oriented to person, place, and time. Epicritic sensation to light touch grossly present bilaterally.  Dermatologic Painful ingrowing nail at medial nail borders of the hallux nail right. No other open wounds. No skin lesions.  Orthopedic: Normal joint ROM without pain or crepitus bilaterally. No visible deformities. No bony tenderness.   Radiographs: None Assessment:  No diagnosis found. Plan:  Patient was evaluated and treated and all questions answered.  Ingrown Nail, right -Patient elects to proceed with minor surgery to remove ingrown toenail removal today. Consent reviewed and signed by patient. -Ingrown nail excised. See procedure note. -Educated on post-procedure care including soaking. Written instructions provided and reviewed. -Patient to follow up in 2  weeks for nail check.  Procedure: Excision of Ingrown Toenail Location: Right 1st toe medial nail borders. Anesthesia: Lidocaine 1% plain; 1.5 mL and Marcaine 0.5% plain; 1.5 mL, digital block. Skin Prep: Betadine. Dressing: Silvadene; telfa; dry, sterile, compression dressing. Technique: Following skin prep, the toe was exsanguinated and a tourniquet was secured at the base of the toe. The affected nail border was freed, split with a nail splitter,  and excised. Chemical matrixectomy was then performed with phenol and irrigated out with alcohol. The tourniquet was then removed and sterile dressing applied. Disposition: Patient tolerated procedure well. Patient to return in 2 weeks for follow-up.   No follow-ups on file.

## 2023-02-25 DIAGNOSIS — H04123 Dry eye syndrome of bilateral lacrimal glands: Secondary | ICD-10-CM | POA: Diagnosis not present

## 2023-05-19 DIAGNOSIS — M7062 Trochanteric bursitis, left hip: Secondary | ICD-10-CM | POA: Diagnosis not present

## 2023-05-22 DIAGNOSIS — M7062 Trochanteric bursitis, left hip: Secondary | ICD-10-CM | POA: Diagnosis not present

## 2023-05-22 DIAGNOSIS — M25552 Pain in left hip: Secondary | ICD-10-CM | POA: Diagnosis not present

## 2023-05-25 DIAGNOSIS — M7062 Trochanteric bursitis, left hip: Secondary | ICD-10-CM | POA: Diagnosis not present

## 2023-05-25 DIAGNOSIS — M25552 Pain in left hip: Secondary | ICD-10-CM | POA: Diagnosis not present

## 2023-05-27 DIAGNOSIS — M7062 Trochanteric bursitis, left hip: Secondary | ICD-10-CM | POA: Diagnosis not present

## 2023-05-27 DIAGNOSIS — M25552 Pain in left hip: Secondary | ICD-10-CM | POA: Diagnosis not present

## 2023-06-17 DIAGNOSIS — M25552 Pain in left hip: Secondary | ICD-10-CM | POA: Diagnosis not present

## 2023-06-17 DIAGNOSIS — M7062 Trochanteric bursitis, left hip: Secondary | ICD-10-CM | POA: Diagnosis not present

## 2023-06-18 DIAGNOSIS — M7062 Trochanteric bursitis, left hip: Secondary | ICD-10-CM | POA: Diagnosis not present

## 2023-06-24 DIAGNOSIS — M25552 Pain in left hip: Secondary | ICD-10-CM | POA: Diagnosis not present

## 2023-06-24 DIAGNOSIS — M7062 Trochanteric bursitis, left hip: Secondary | ICD-10-CM | POA: Diagnosis not present

## 2023-07-06 DIAGNOSIS — H04123 Dry eye syndrome of bilateral lacrimal glands: Secondary | ICD-10-CM | POA: Diagnosis not present

## 2023-07-06 DIAGNOSIS — Z961 Presence of intraocular lens: Secondary | ICD-10-CM | POA: Diagnosis not present

## 2023-07-06 DIAGNOSIS — H2511 Age-related nuclear cataract, right eye: Secondary | ICD-10-CM | POA: Diagnosis not present

## 2023-07-06 DIAGNOSIS — H40013 Open angle with borderline findings, low risk, bilateral: Secondary | ICD-10-CM | POA: Diagnosis not present

## 2023-07-29 DIAGNOSIS — H2511 Age-related nuclear cataract, right eye: Secondary | ICD-10-CM | POA: Diagnosis not present

## 2023-07-31 DIAGNOSIS — H25811 Combined forms of age-related cataract, right eye: Secondary | ICD-10-CM | POA: Diagnosis not present

## 2023-08-24 ENCOUNTER — Other Ambulatory Visit: Payer: Self-pay | Admitting: Family Medicine

## 2023-08-24 DIAGNOSIS — Z1231 Encounter for screening mammogram for malignant neoplasm of breast: Secondary | ICD-10-CM

## 2023-08-28 ENCOUNTER — Ambulatory Visit

## 2023-09-01 ENCOUNTER — Ambulatory Visit
Admission: RE | Admit: 2023-09-01 | Discharge: 2023-09-01 | Disposition: A | Discharge status: Home / Self Care | Source: Ambulatory Visit | Attending: Family Medicine | Admitting: Family Medicine

## 2023-09-01 DIAGNOSIS — Z1231 Encounter for screening mammogram for malignant neoplasm of breast: Secondary | ICD-10-CM

## 2023-09-02 DIAGNOSIS — J309 Allergic rhinitis, unspecified: Secondary | ICD-10-CM | POA: Diagnosis not present

## 2023-09-02 DIAGNOSIS — Z0000 Encounter for general adult medical examination without abnormal findings: Secondary | ICD-10-CM | POA: Diagnosis not present

## 2023-09-02 DIAGNOSIS — M545 Low back pain, unspecified: Secondary | ICD-10-CM | POA: Diagnosis not present

## 2023-09-02 DIAGNOSIS — R059 Cough, unspecified: Secondary | ICD-10-CM | POA: Diagnosis not present

## 2023-09-02 DIAGNOSIS — I10 Essential (primary) hypertension: Secondary | ICD-10-CM | POA: Diagnosis not present

## 2023-09-02 DIAGNOSIS — K219 Gastro-esophageal reflux disease without esophagitis: Secondary | ICD-10-CM | POA: Diagnosis not present

## 2023-09-02 DIAGNOSIS — R7303 Prediabetes: Secondary | ICD-10-CM | POA: Diagnosis not present

## 2023-09-02 DIAGNOSIS — M8588 Other specified disorders of bone density and structure, other site: Secondary | ICD-10-CM | POA: Diagnosis not present

## 2023-09-02 DIAGNOSIS — G4733 Obstructive sleep apnea (adult) (pediatric): Secondary | ICD-10-CM | POA: Diagnosis not present

## 2023-09-03 ENCOUNTER — Other Ambulatory Visit: Payer: Self-pay | Admitting: Family Medicine

## 2023-09-03 DIAGNOSIS — R928 Other abnormal and inconclusive findings on diagnostic imaging of breast: Secondary | ICD-10-CM

## 2023-09-17 ENCOUNTER — Ambulatory Visit: Admission: RE | Admit: 2023-09-17 | Source: Ambulatory Visit

## 2023-09-17 ENCOUNTER — Ambulatory Visit
Admission: RE | Admit: 2023-09-17 | Discharge: 2023-09-17 | Disposition: A | Discharge status: Home / Self Care | Source: Ambulatory Visit | Attending: Family Medicine | Admitting: Family Medicine

## 2023-09-17 DIAGNOSIS — R928 Other abnormal and inconclusive findings on diagnostic imaging of breast: Secondary | ICD-10-CM

## 2023-10-03 DIAGNOSIS — I10 Essential (primary) hypertension: Secondary | ICD-10-CM | POA: Diagnosis not present

## 2023-11-03 DIAGNOSIS — J069 Acute upper respiratory infection, unspecified: Secondary | ICD-10-CM | POA: Diagnosis not present

## 2023-12-03 DIAGNOSIS — I10 Essential (primary) hypertension: Secondary | ICD-10-CM | POA: Diagnosis not present
